# Patient Record
Sex: Female | Born: 1975 | Race: White | Hispanic: No | Marital: Married | Smoking: Never smoker
Health system: Southern US, Community
[De-identification: ages and names within clinical notes are randomized; demographics above are authoritative.]

## PROBLEM LIST (undated history)

## (undated) DIAGNOSIS — Z87440 Personal history of urinary (tract) infections: Secondary | ICD-10-CM

## (undated) DIAGNOSIS — B379 Candidiasis, unspecified: Secondary | ICD-10-CM

## (undated) DIAGNOSIS — F32A Depression, unspecified: Secondary | ICD-10-CM

## (undated) DIAGNOSIS — M51369 Other intervertebral disc degeneration, lumbar region without mention of lumbar back pain or lower extremity pain: Secondary | ICD-10-CM

## (undated) DIAGNOSIS — Z22330 Carrier of Group B streptococcus: Secondary | ICD-10-CM

## (undated) DIAGNOSIS — M5126 Other intervertebral disc displacement, lumbar region: Secondary | ICD-10-CM

## (undated) DIAGNOSIS — Z8619 Personal history of other infectious and parasitic diseases: Secondary | ICD-10-CM

## (undated) DIAGNOSIS — F329 Major depressive disorder, single episode, unspecified: Secondary | ICD-10-CM

## (undated) HISTORY — PX: OTHER SURGICAL HISTORY: SHX169

## (undated) HISTORY — DX: Personal history of other infectious and parasitic diseases: Z86.19

## (undated) HISTORY — DX: Depression, unspecified: F32.A

## (undated) HISTORY — PX: WISDOM TOOTH EXTRACTION: SHX21

## (undated) HISTORY — DX: Carrier of group B Streptococcus: Z22.330

## (undated) HISTORY — DX: Candidiasis, unspecified: B37.9

## (undated) HISTORY — DX: Major depressive disorder, single episode, unspecified: F32.9

## (undated) HISTORY — DX: Personal history of urinary (tract) infections: Z87.440

## (undated) HISTORY — DX: Other intervertebral disc displacement, lumbar region: M51.26

## (undated) HISTORY — DX: Other intervertebral disc degeneration, lumbar region without mention of lumbar back pain or lower extremity pain: M51.369

---

## 2000-12-18 ENCOUNTER — Other Ambulatory Visit: Admission: RE | Admit: 2000-12-18 | Discharge: 2000-12-18 | Payer: Self-pay | Admitting: Family Medicine

## 2002-11-23 ENCOUNTER — Other Ambulatory Visit: Admission: RE | Admit: 2002-11-23 | Discharge: 2002-11-23 | Payer: Self-pay | Admitting: Gynecology

## 2004-01-03 ENCOUNTER — Other Ambulatory Visit: Admission: RE | Admit: 2004-01-03 | Discharge: 2004-01-03 | Payer: Self-pay | Admitting: Gynecology

## 2005-01-14 ENCOUNTER — Other Ambulatory Visit: Admission: RE | Admit: 2005-01-14 | Discharge: 2005-01-14 | Payer: Self-pay | Admitting: Gynecology

## 2006-01-15 ENCOUNTER — Other Ambulatory Visit: Admission: RE | Admit: 2006-01-15 | Discharge: 2006-01-15 | Payer: Self-pay | Admitting: Gynecology

## 2006-10-27 ENCOUNTER — Inpatient Hospital Stay (HOSPITAL_COMMUNITY): Admission: AD | Admit: 2006-10-27 | Discharge: 2006-10-29 | Payer: Self-pay | Admitting: Obstetrics and Gynecology

## 2007-10-15 ENCOUNTER — Inpatient Hospital Stay (HOSPITAL_COMMUNITY): Admission: RE | Admit: 2007-10-15 | Discharge: 2007-10-15 | Payer: Self-pay | Admitting: Obstetrics and Gynecology

## 2007-10-30 ENCOUNTER — Inpatient Hospital Stay (HOSPITAL_COMMUNITY): Admission: AD | Admit: 2007-10-30 | Discharge: 2007-11-01 | Payer: Self-pay | Admitting: Obstetrics and Gynecology

## 2010-08-06 NOTE — H&P (Signed)
NAMEYAZAIRA, SPEAS                ACCOUNT NO.:  [id number]   MEDICAL RECORD NO.:  [id number]          PATIENT TYPE:  INP   LOCATION:  9162                          FACILITY:  [facility]   PHYSICIAN:  Osborn Coho, M.D.   DATE OF BIRTH:  13-Feb-1976   DATE OF ADMISSION:  10/30/2007  DATE OF DISCHARGE:                              HISTORY & PHYSICAL   This is a 35 year old gravida 2, para 1-0-0-1 at 38-0/7th weeks, who  presents with contractions for several hours.  She denies leaking,  bleeding, or reports positive fetal movement.  Pregnancy has been  followed by the nurse midwife service and remarkable for  1. Elevated BMI.  2. Closely spaced pregnancy.  3. History of depression.  4. History of prolonged postpartum bleeding.  5. Group B strep negative.   ALLERGIES:  None.   OB HISTORY:  Remarkable for a vaginal delivery in 2008 of a female infant  at [redacted] weeks gestation weighing 7 pounds 10 ounces.  Remarkable for 6  weeks of bleeding postpartum.   PAST MEDICAL HISTORY:  Remarkable for childhood varicella, prolonged  postpartum bleeding, and history of depression in the past.   PAST SURGICAL HISTORY:  Remarkable for right knee surgery at age 63.   FAMILY HISTORY:  Remarkable for grandmother with heart disease, aunt  with diabetes, cousin with thyroid problems, and grandmother with stroke  and lung cancer.   GENETIC HISTORY:  Negative.   SOCIAL HISTORY:  The patient is married to [employer], who is  involved and supportive.  She is of the [place] faith.  She denies any  alcohol, tobacco, or drug use.   PRENATAL LABS:  Hemoglobin 11.4 and platelets 318.  Blood type O+.  Antibody screen negative.  RPR nonreactive.  Rubella immune.  Hepatitis  negative.  HIV negative.  Pap test normal.  Gonorrhea negative.  Chlamydia negative.  Cystic fibrosis negative.   HISTORY OF CURRENT PREGNANCY:  The patient entered care at [redacted] weeks  gestation.  She declined first trimester screen.   Ultrasound at that  time was normal.  She had an ultrasound for anatomy at 18 weeks that was  normal.  Glucola at 29 weeks was elevated.  A 3-hour GTT was normal.  She was placed on iron for some anemia, which resolved by the end of  pregnancy and her group B strep was negative at term.   OBJECTIVE DATA:  VITAL SIGNS:  Stable, afebrile.  HEENT:  Within normal limits.  NECK:  Thyroid normal not enlarged.  CHEST:  Clear to auscultation.  HEART:  Regular rate and rhythm.  ABDOMEN:  Gravid at 38 cm.  VERTEX:  Leopold's exam shows reactive fetal heart rate with  contractions every 4 minutes.  Cervix is 3 cm, completely effaced, -1 to  0 station, and vertex presentation.  Her cervix had been 1 cm 2 days  ago.  EXTREMITIES:  Within normal limits.   ASSESSMENT:  1. Intrauterine pregnancy at 38-0/7th weeks.  2. Active labor.   PLAN:  1. Admit to birthing suites per Dr. Su Hilt.  2. Routine CNM orders.  3. Plans epidural.      Elby Showers. Williams, C.N.M.      Osborn Coho, M.D.  Electronically Signed    MLW/MEDQ  D:  10/30/2007  T:  10/30/2007  Job:  [id number]

## 2010-08-06 NOTE — H&P (Signed)
NAMEROSELAND, BRAUN                ACCOUNT NO.:  [id number]   MEDICAL RECORD NO.:  [id number]          PATIENT TYPE:  MAT   LOCATION:  MATC                          [facility]ILITY:  [facility]   PHYSICIAN:  Hal Morales, M.D.DATE OF BIRTH:  [birth year]   DATE OF ADMISSION:  10/27/2006  DATE OF DISCHARGE:                              HISTORY & PHYSICAL   Ms. Carreno is a 35 year old, gravida 1, para 0, at 52 and 2/7ths weeks,  who presented complaining of uterine contractions every 2-3 minutes  since 5 a.m. with a small amount of bloody show noted.   The pregnancy has been remarkable for:  1. Positive group B strep.  2. First trimester spotting.  3. History of depression, but no medications at present.   PRENATAL LABORATORIES:  Blood type is O positive, Rh antibody negative,  VDRL nonreactive, rubella titer positive, hepatitis B surface antigen  negative, HIV nonreactive.  Cystic fibrosis testing was negative.  GC  and Chlamydia cultures were negative in October.  Pap was normal in  October.  Hemoglobin upon entering the practice was 12.3.  It was 10.8  at 26 weeks.  Quadruple screen and first trimester screen were declined.  Glucola was normal.  Group B strep culture was positive at 36 weeks.  GC  and Chlamydia cultures were negative.   HISTORY OF PRESENT PREGNANCY:  The patient entered care at approximately  10 weeks.  She decline quadruple screen and first trimester screening.  She had an ultrasound at 18 weeks showing normal growth and development.  She had had some spotting in early pregnancy and had had a 7 week  ultrasound which confirmed dating.  She had some musculoskeletal issues  in her rib cage starting at approximately 22 weeks.  She also had some  reflux, for which she used Zantac.  Her Glucola was normal.  Her  hemoglobin was 10.8 at 26 weeks.  She had some decreased fetal movement  at 32 weeks.  NST was nonreactive.  BPP was 8/8 with normal fluid and  estimated fetal weight  was at the 76th percentile.  At 36 weeks, GC and  Chlamydia cultures were done, group B strep culture was done.  Group B  strep culture was positive.  Other cultures were negative.  She had some  left ear pain at that time and was recommended to try some Sudafed.  The  rest of her pregnancy has been essentially uncomplicated.  She was seen  at 39 weeks for decreased fetal movement.  She had a reactive NST.  She  was seen yesterday in the office and cervix was closed.   OBSTETRICAL HISTORY:  The patient is a primigravida.   MEDICAL HISTORY:  1. She is a previous condom user.  2. She has occasional urinary tract infections.  3. She had right knee surgery at age 45.   ALLERGIES:  She has no known medication allergies.   FAMILY HISTORY:  Her paternal grandmother has heart disease.  Maternal  cousin has asthma.  Maternal aunt has diabetes.  Maternal cousin has  thyroid disease.  Maternal grandmother  had a stroke.  Maternal  grandmother also had lung cancer.   GENETIC HISTORY:  Unremarkable.   SOCIAL HISTORY:  The patient is married.  Father of the baby is involved  and supportive.  His name is Darlina Mccaughey.  The patient is Caucasian, of  the [place] faith.  She has 2 years of college.  She is employed as an  Print production planner.  Her husband is college educated.  He is a Patent attorney.  She  has been followed by the physician's service at [facility].  She denies any alcohol, drug or tobacco use during this pregnancy.  She  has had some depression in the past but has not required any medication.   PHYSICAL EXAMINATION:  VITAL SIGNS:  Blood pressure is 134/84.  Other  vital signs are stable.  The patient is in moderate distress with  contractions.  HEENT:  Within normal limits.  LUNGS:  The breath sounds are clear.  HEART:  Regular rate and rhythm without murmur.  BREASTS:  Soft and nontender.  ABDOMEN:  Fundal height is approximately 38 cm.  Estimated fetal weight  is 7.5 to 8  pounds.  Uterine contractions are every 2-3 minutes,  moderate quality.  PELVIC:  Cervix is 3 cm, 100%, vertex at a -1 station with an intact bag  of water.  There is a small amount of bloody show noted.  Fetal heart  rate is reactive with no decelerations.  EXTREMITIES:  Deep tendon reflexes are 2+ without clonus.  There is a  trace edema noted.   IMPRESSION:  1. Intrauterine pregnancy at 40 and 2/7ths weeks.  2. Early labor.  3. Positive group B strep.   PLAN:  1. Admit to birthing suite for consult with Dr. Pennie Rushing as attending      physician.  2. Routine physician orders.  3. Penicillin G per standard dosing for group B strep prophylaxis.  4. Pain medication p.r.n. with patient initially preferring IV pain      medication, but then may move to possible epidural.      Chip Boer L. Emilee Hero, C.N.M.      Hal Morales, M.D.  Electronically Signed    VLL/MEDQ  D:  10/27/2006  T:  10/27/2006  Job:  [id number]

## 2010-12-20 LAB — CBC
HCT: 32.5 — ABNORMAL LOW
Hemoglobin: 12.2
MCV: 94.1
RBC: 3.45 — ABNORMAL LOW
RBC: 3.83 — ABNORMAL LOW
WBC: 13.9 — ABNORMAL HIGH

## 2010-12-20 LAB — RPR: RPR Ser Ql: NONREACTIVE

## 2011-01-06 LAB — CBC
HCT: 37.4
Hemoglobin: 10.1 — ABNORMAL LOW
Hemoglobin: 12.5
MCHC: 34.5
MCV: 92.8
MCV: 92.8
Platelets: 304
RDW: 13.8
RDW: 14
WBC: 14.7 — ABNORMAL HIGH

## 2011-04-28 ENCOUNTER — Other Ambulatory Visit: Payer: Self-pay | Admitting: Family Medicine

## 2011-04-28 DIAGNOSIS — M545 Low back pain: Secondary | ICD-10-CM

## 2011-04-30 ENCOUNTER — Ambulatory Visit
Admission: RE | Admit: 2011-04-30 | Discharge: 2011-04-30 | Disposition: A | Discharge status: Home / Self Care | Payer: 59 | Source: Ambulatory Visit | Attending: Family Medicine | Admitting: Family Medicine

## 2011-04-30 DIAGNOSIS — M545 Low back pain: Secondary | ICD-10-CM

## 2011-10-02 ENCOUNTER — Telehealth: Payer: Self-pay | Admitting: Obstetrics and Gynecology

## 2011-10-02 NOTE — Telephone Encounter (Signed)
TRIAGE/RX °

## 2011-10-03 ENCOUNTER — Telehealth: Payer: Self-pay

## 2011-10-03 NOTE — Telephone Encounter (Signed)
TC TO PT. LEFT VM THAT HER RX IS READY FOR PICK UP AT [facility],.

## 2011-10-07 ENCOUNTER — Other Ambulatory Visit: Payer: Self-pay | Admitting: Obstetrics and Gynecology

## 2011-10-22 ENCOUNTER — Encounter: Payer: Self-pay | Admitting: Obstetrics and Gynecology

## 2011-10-22 ENCOUNTER — Ambulatory Visit (INDEPENDENT_AMBULATORY_CARE_PROVIDER_SITE_OTHER): Payer: 59 | Admitting: Obstetrics and Gynecology

## 2011-10-22 VITALS — BP 114/68 | HR 70 | Ht 63.0 in | Wt 206.0 lb

## 2011-10-22 DIAGNOSIS — Z01419 Encounter for gynecological examination (general) (routine) without abnormal findings: Secondary | ICD-10-CM

## 2011-10-22 DIAGNOSIS — Z124 Encounter for screening for malignant neoplasm of cervix: Secondary | ICD-10-CM

## 2011-10-22 MED ORDER — NORETHIN ACE-ETH ESTRAD-FE 1-20 MG-MCG PO TABS: 1.0000 | ORAL_TABLET | Freq: Every day | ORAL | Status: DC

## 2011-10-22 NOTE — Progress Notes (Signed)
Subjective:    Tricia Todd is a 36 y.o. female, G2P2002, who presents for an annual exam. The patient reports becoming violentlyy ill  with the non-Junel generic.  Only wants Junel.  Menstrual cycle:   LMP: Patient's last menstrual period was 10/08/2011.           Review of Systems Pertinent items are noted in HPI. Denies pelvic pain, urinary tract symptoms, vaginitis symptoms, irregular bleeding, menopausal symptoms, change in bowel habits or rectal bleeding   Objective:    BP 114/68  Pulse 70  Ht 5\' 3"  (1.6 m)  Wt 206 lb (93.441 kg)  BMI 36.49 kg/m2  LMP 10/08/2011    Wt Readings from Last 1 Encounters:  10/22/11 206 lb (93.441 kg)   Body mass index is 36.49 kg/(m^2). General Appearance: Alert, no acute distress HEENT: Grossly normal Neck / Thyroid: Supple, no thyromegaly or cervical adenopathy Lungs: Clear to auscultation bilaterally Back: No CVA tenderness Breast Exam: No masses or nodes.No dimpling, nipple retraction or discharge. Cardiovascular: Regular rate and rhythm.  Gastrointestinal: Soft, non-tender, no masses or organomegaly Pelvic Exam: EGBUS-wnl, vagina-normal rugae, cervix- without lesions or tenderness, uterus appears normal size shape and consistency, adnexae-no masses or tenderness Lymphatic Exam: Non-palpable nodes in neck, clavicular,  axillary, or inguinal regions  Skin: no rashes or abnormalities Extremities: no clubbing cyanosis or edema  Neurologic: grossly normal Psychiatric: Alert and oriented    Assessment:   Routine GYN Exam Herniated Lumbar Disc Plan:  Junel 1/20  #1  1 po qd 11 refills  Brand Medically Necessary  PAP sent  RTO 1 year or prn  Darnella Zeiter,ELMIRAPA-C

## 2011-10-22 NOTE — Progress Notes (Signed)
Regular Periods: yes Mammogram: no  Monthly Breast Ex.: no Exercise: yes  Tetanus < 10 years: no Seatbelts: yes  NI. Bladder Functn.: yes Abuse at home: no  Daily BM's: yes Stressful Work: yes  Healthy Diet: yes Sigmoid-Colonoscopy: NO  Calcium: no Medical problems this year: NO   LAST PAP:2012  Contraception: JUNEL 1/20  Mammogram:  NO  PCP: DR. Azucena Cecil  PMH: DX WITH PROTRUDING DISC (LOWER DISC)  FMH: NO CHANGE  Last Bone Scan: NO

## 2011-10-23 LAB — PAP IG W/ RFLX HPV ASCU

## 2014-01-23 ENCOUNTER — Encounter: Payer: Self-pay | Admitting: Obstetrics and Gynecology

## 2017-05-01 DIAGNOSIS — K219 Gastro-esophageal reflux disease without esophagitis: Secondary | ICD-10-CM | POA: Diagnosis not present

## 2017-05-01 DIAGNOSIS — M5136 Other intervertebral disc degeneration, lumbar region: Secondary | ICD-10-CM | POA: Diagnosis not present

## 2017-05-01 DIAGNOSIS — F419 Anxiety disorder, unspecified: Secondary | ICD-10-CM | POA: Diagnosis not present

## 2017-07-02 DIAGNOSIS — Z01419 Encounter for gynecological examination (general) (routine) without abnormal findings: Secondary | ICD-10-CM | POA: Diagnosis not present

## 2017-07-02 DIAGNOSIS — Z1231 Encounter for screening mammogram for malignant neoplasm of breast: Secondary | ICD-10-CM | POA: Diagnosis not present

## 2017-07-02 DIAGNOSIS — Z113 Encounter for screening for infections with a predominantly sexual mode of transmission: Secondary | ICD-10-CM | POA: Diagnosis not present

## 2017-07-02 DIAGNOSIS — Z3040 Encounter for surveillance of contraceptives, unspecified: Secondary | ICD-10-CM | POA: Diagnosis not present

## 2017-07-02 DIAGNOSIS — Z124 Encounter for screening for malignant neoplasm of cervix: Secondary | ICD-10-CM | POA: Diagnosis not present

## 2017-07-02 DIAGNOSIS — Z6841 Body Mass Index (BMI) 40.0 and over, adult: Secondary | ICD-10-CM | POA: Diagnosis not present

## 2017-07-23 DIAGNOSIS — R922 Inconclusive mammogram: Secondary | ICD-10-CM | POA: Diagnosis not present

## 2018-06-03 DIAGNOSIS — M7989 Other specified soft tissue disorders: Secondary | ICD-10-CM | POA: Diagnosis not present

## 2018-06-03 DIAGNOSIS — R202 Paresthesia of skin: Secondary | ICD-10-CM | POA: Diagnosis not present

## 2018-06-03 DIAGNOSIS — F419 Anxiety disorder, unspecified: Secondary | ICD-10-CM | POA: Diagnosis not present

## 2018-06-03 DIAGNOSIS — R5383 Other fatigue: Secondary | ICD-10-CM | POA: Diagnosis not present

## 2018-06-03 DIAGNOSIS — M5136 Other intervertebral disc degeneration, lumbar region: Secondary | ICD-10-CM | POA: Diagnosis not present

## 2018-09-13 ENCOUNTER — Other Ambulatory Visit: Payer: Self-pay

## 2018-09-13 ENCOUNTER — Emergency Department (HOSPITAL_COMMUNITY)
Admission: EM | Admit: 2018-09-13 | Discharge: 2018-09-14 | Disposition: A | Discharge status: Home / Self Care | Payer: BC Managed Care – PPO | Attending: Emergency Medicine | Admitting: Emergency Medicine

## 2018-09-13 ENCOUNTER — Emergency Department (HOSPITAL_COMMUNITY): Payer: BC Managed Care – PPO

## 2018-09-13 ENCOUNTER — Encounter (HOSPITAL_COMMUNITY): Payer: Self-pay

## 2018-09-13 DIAGNOSIS — R0789 Other chest pain: Secondary | ICD-10-CM | POA: Diagnosis not present

## 2018-09-13 DIAGNOSIS — R0602 Shortness of breath: Secondary | ICD-10-CM | POA: Diagnosis not present

## 2018-09-13 DIAGNOSIS — R079 Chest pain, unspecified: Secondary | ICD-10-CM

## 2018-09-13 DIAGNOSIS — Z793 Long term (current) use of hormonal contraceptives: Secondary | ICD-10-CM | POA: Insufficient documentation

## 2018-09-13 LAB — CBC
HCT: 38 % (ref 36.0–46.0)
Hemoglobin: 12.2 g/dL (ref 12.0–15.0)
MCH: 29.9 pg (ref 26.0–34.0)
MCHC: 32.1 g/dL (ref 30.0–36.0)
MCV: 93.1 fL (ref 80.0–100.0)
Platelets: 295 10*3/uL (ref 150–400)
RBC: 4.08 MIL/uL (ref 3.87–5.11)
RDW: 12 % (ref 11.5–15.5)
WBC: 11.2 10*3/uL — ABNORMAL HIGH (ref 4.0–10.5)
nRBC: 0 % (ref 0.0–0.2)

## 2018-09-13 LAB — BASIC METABOLIC PANEL
Anion gap: 10 (ref 5–15)
BUN: 12 mg/dL (ref 6–20)
CO2: 21 mmol/L — ABNORMAL LOW (ref 22–32)
Calcium: 9.1 mg/dL (ref 8.9–10.3)
Chloride: 106 mmol/L (ref 98–111)
Creatinine, Ser: 0.99 mg/dL (ref 0.44–1.00)
GFR calc Af Amer: >60 mL/min (ref >60)
GFR calc non Af Amer: >60 mL/min (ref >60)
Glucose, Bld: 140 mg/dL — ABNORMAL HIGH (ref 70–99)
Potassium: 4 mmol/L (ref 3.5–5.1)
Sodium: 137 mmol/L (ref 135–145)

## 2018-09-13 LAB — I-STAT BETA HCG BLOOD, ED (MC, WL, AP ONLY): I-stat hCG, quantitative: <5 m[IU]/mL (ref <5)

## 2018-09-13 LAB — TROPONIN I: Troponin I: <0.03 ng/mL (ref <0.03)

## 2018-09-13 MED ORDER — SODIUM CHLORIDE 0.9% FLUSH: 3.0000 mL | Freq: Once | INTRAVENOUS | Status: DC

## 2018-09-13 NOTE — ED Triage Notes (Signed)
Pt states she was playing with her kid in the yard when she suddenly felt 10/10 chest pain radiating to her back, accompanied by SOB. Pt called EMS told her that all of her VS, EKG, CBG looked WNL, however, advised her to still come to the ED for lab work. Currently pt states she is feeling slightly SOB.

## 2018-09-14 ENCOUNTER — Encounter (HOSPITAL_COMMUNITY): Payer: Self-pay | Admitting: Student

## 2018-09-14 LAB — HEPATIC FUNCTION PANEL
ALT: 20 U/L (ref 0–44)
AST: 19 U/L (ref 15–41)
Albumin: 3.5 g/dL (ref 3.5–5.0)
Alkaline Phosphatase: 71 U/L (ref 38–126)
Bilirubin, Direct: 0.1 mg/dL (ref 0.0–0.2)
Indirect Bilirubin: 0.7 mg/dL (ref 0.3–0.9)
Total Bilirubin: 0.8 mg/dL (ref 0.3–1.2)
Total Protein: 6.9 g/dL (ref 6.5–8.1)

## 2018-09-14 LAB — TROPONIN I: Troponin I: <0.03 ng/mL (ref <0.03)

## 2018-09-14 LAB — LIPASE, BLOOD: Lipase: 28 U/L (ref 11–51)

## 2018-09-14 MED ORDER — ASPIRIN 81 MG PO CHEW
324.0000 mg | CHEWABLE_TABLET | Freq: Once | ORAL | Status: AC
  Administered 2018-09-14: 324 mg via ORAL
  Filled 2018-09-14: qty 4

## 2018-09-14 NOTE — ED Provider Notes (Signed)
[place] EMERGENCY DEPARTMENT Provider Note   CSN: [id number] Arrival date & time: 09/13/18  1844     History   Chief Complaint Chief Complaint  Patient presents with  . Chest Pain    HPI Tricia Todd is a 43 y.o. female with a history of depression who presents to the emergency department via EMS with complaints of chest pain that began around 17:00 this afternoon which is fairly resolved at present.  Patient states that she was sitting in the pool with her children in the backyard at rest when she developed discomfort to her upper abdomen which then radiated up into her chest and around both sides of her rib cage, not straight through to the back.  She states the pain felt like a pressure and heaviness with associated shortness of breath, anxiety, and diaphoresis.  Symptoms remain fairly constant, were severe at the time, without alleviating or aggravating factors.  No change occurred with exertion or with a deep breath.  She tried resting, pacing, and having a glass of water without relief.  Ultimately decided to call 911.  Upon EMS arrival patient states symptoms had fairly much resolved.  They recommended she come to the emergency department for evaluation.  Symptoms have not reoccurred since initial episode.  She states she has had some remaining very mild chest pressure that is about a 2 out of 10 at this time.  No history of similar discomfort.  Denies fever, chills, nausea, vomiting, lightheadedness, dizziness, syncope, diarrhea, melena, hematochezia, dysuria, leg pain/swelling, hemoptysis, recent surgery/trauma, recent long travel,  personal hx of cancer, or hx of DVT/PE.  Denies early family history of CAD.  She does take birth control.        HPI  Past Medical History:  Diagnosis Date  . Depression   . GBS carrier   . H/O varicella   . History of rubella   . Hx: UTI (urinary tract infection)   . L4-L5 disc bulge    PROTRUDING DISC  . Yeast  infection     There are no active problems to display for this patient.   Past Surgical History:  Procedure Laterality Date  . right knee surgery      Age 60  . WISDOM TOOTH EXTRACTION       OB History    Gravida  2   Para  2   Term  2   Preterm      AB      Living  2     SAB      TAB      Ectopic      Multiple      Live Births  2            Home Medications    Prior to Admission medications   Medication Sig Start Date End Date Taking? Authorizing Provider  HYDROcodone-acetaminophen (VICODIN) 5-500 MG per tablet Take 1 tablet by mouth every 6 (six) hours as needed.    [provider]  MICROGESTIN FE 1/20 1-20 MG-MCG tablet TAKE 1 TABLET BY MOUTH EVERY DAY 10/07/11   Earnstine Regal, PA-C  norethindrone-ethinyl estradiol (JUNEL FE,GILDESS FE,LOESTRIN FE) 1-20 MG-MCG tablet Take 1 tablet by mouth daily. 10/22/11   Earnstine Regal, PA-C  PREDNISONE PO Take by mouth.    [provider]    Family History Family History  Problem Relation Age of Onset  . Stroke Other   . Diabetes Maternal Aunt   . Cancer  Maternal Grandmother        Lung  . Heart disease Paternal Grandmother   . Cancer Maternal Uncle   . Cancer Maternal Uncle   . Heart attack Maternal Uncle     Social History Social History   Tobacco Use  . Smoking status: Never Smoker  . Smokeless tobacco: Never Used  Substance Use Topics  . Alcohol use: Yes    Comment: OCCASIONAL  . Drug use: No     Allergies   Patient has no known allergies.   Review of Systems Review of Systems  Constitutional: Positive for diaphoresis. Negative for chills and fever.  HENT: Negative for congestion, ear pain and sore throat.   Respiratory: Positive for shortness of breath. Negative for cough.   Cardiovascular: Positive for chest pain. Negative for palpitations and leg swelling.  Gastrointestinal: Positive for abdominal pain. Negative for blood in stool, constipation, diarrhea, nausea  and vomiting.  Genitourinary: Negative for dysuria, vaginal bleeding and vaginal discharge.  Neurological: Negative for dizziness, seizures, syncope, speech difficulty, weakness, light-headedness, numbness and headaches.  All other systems reviewed and are negative.    Physical Exam Updated Vital Signs BP 125/86   Pulse 85   Temp 98.5 F (36.9 C) (Oral)   Resp (!) 29   LMP 08/30/2018 (Within Days)   SpO2 99%   Physical Exam Vitals signs and nursing note reviewed.  Constitutional:      General: She is not in acute distress.    Appearance: She is well-developed. She is not toxic-appearing.  HENT:     Head: Normocephalic and atraumatic.  Eyes:     General:        Right eye: No discharge.        Left eye: No discharge.     Conjunctiva/sclera: Conjunctivae normal.  Neck:     Musculoskeletal: Neck supple.  Cardiovascular:     Rate and Rhythm: Normal rate and regular rhythm.     Pulses:          Radial pulses are 2+ on the right side and 2+ on the left side.       Posterior tibial pulses are 2+ on the right side and 2+ on the left side.  Pulmonary:     Effort: Pulmonary effort is normal. No respiratory distress.     Breath sounds: Normal breath sounds. No wheezing, rhonchi or rales.  Abdominal:     General: There is no distension.     Palpations: Abdomen is soft.     Tenderness: There is no abdominal tenderness. There is no guarding or rebound.  Musculoskeletal:     Right lower leg: She exhibits no tenderness. No edema.     Left lower leg: She exhibits no tenderness. No edema.  Skin:    General: Skin is warm and dry.     Findings: No rash.  Neurological:     Mental Status: She is alert.     Comments: Clear speech.   Psychiatric:        Behavior: Behavior normal.    ED Treatments / Results  Labs (all labs ordered are listed, but only abnormal results are displayed) Labs Reviewed  BASIC METABOLIC PANEL - Abnormal; Notable for the following components:      Result  Value   CO2 21 (*)    Glucose, Bld 140 (*)    All other components within normal limits  CBC - Abnormal; Notable for the following components:   WBC 11.2 (*)  All other components within normal limits  TROPONIN I  TROPONIN I  LIPASE, BLOOD  HEPATIC FUNCTION PANEL  I-STAT BETA HCG BLOOD, ED ([facility], [facility], [facility] ONLY)    EKG   EKG Interpretation  Date/Time:  Monday September 13 2018 19:01:00 EDT Ventricular Rate:  76 PR Interval:  152 QRS Duration: 80 QT Interval:  384 QTC Calculation: 432 R Axis:   12 Text Interpretation:  Normal sinus rhythm Cannot rule out Anterior infarct , age undetermined Abnormal ECG Confirmed by Gerhard MunchLockwood, Robert [phone]([id number]) on 09/14/2018 3:18:06 AM  Radiology Dg Chest 2 View  Result Date: 09/13/2018 CLINICAL DATA:  Chest pain and shortness of breath. EXAM: CHEST - 2 VIEW COMPARISON:  None. FINDINGS: The cardiomediastinal contours are normal. Mild bronchial thickening. Pulmonary vasculature is normal. No consolidation, pleural effusion, or pneumothorax. No acute osseous abnormalities are seen. IMPRESSION: Mild bronchial thickening which can be seen with bronchitis or asthma. Electronically Signed   By: Narda RutherfordMelanie  Sanford M.D.   On: 09/13/2018 19:55    Procedures Procedures (including critical care time)  Medications Ordered in ED Medications  sodium chloride flush (NS) 0.9 % injection 3 mL (3 mLs Intravenous Not Given 09/14/18 0350)  aspirin chewable tablet 324 mg (324 mg Oral Given 09/14/18 0346)     Initial Impression / Assessment and Plan / ED Course  I have reviewed the triage vital signs and the nursing notes.  Pertinent labs & imaging results that were available during my care of the patient were reviewed by me and considered in my medical decision making (see chart for details).    Patient presents to the emergency department with chest pain. Patient nontoxic appearing, in no apparent distress, vitals without significant abnormality . Fairly benign physical exam.  DDX: ACS, pulmonary embolism, dissection, pneumothorax, effusion, infiltrate, arrhythmia, anemia, electrolyte derangement, MSK, GERD, pancreatitis, cholecystitis, anxiety. Evaluation initiated with labs, EKG, and CXR. Patient on cardiac monitor.   Work-up in the ER reviewed:  CBC: Nonspecific leukocytosis at 11.2.  No anemia. BMP: Hyperglycemia at 140.  Mildly low bicarb at 21.  No significant electrolyte derangement. Hepatic function panel: WNL Lipase: WNL Troponin: delta negative EKG: no STEMI CXR: Mild bronchial thickening which can be seen with asthma/bronchitis--> lungs CTA, no complaints of fever/cough/wheezing, & her dyspnea is now resolved. Negative, without infiltrate, effusion, pneumothorax, or fracture/dislocation.   Low risk heart score, EKG without obvious ischemia, delta troponin negative, doubt ACS. Patient is low risk wells, doubt pulmonary embolism. Pain is not a tearing sensation, symmetric pulses, no widening of mediastinum on CXR, doubt dissection. No peritoneal signs on abdominal exam, belly labs reassuring- doubt pancreatitis, cholecystitis, or other acute surgical abdominal process. Cardiac monitor reviewed, no notable arrhythmias or tachycardia. occasional documented elevated RR based off automated monitor- suspect these are inaccurate, patient w/ regular respiratory rate on each of my assessments, no signs of respiratory distress. Patient has appeared hemodynamically stable throughout ER visit and appears safe for discharge with close PCP/cardiology follow up. I discussed results, treatment plan, need for PCP follow-up, and return precautions with the patient. Provided opportunity for questions, patient confirmed understanding and is in agreement with plan.   Findings and plan of care discussed with supervising physician Dr. Jeraldine LootsLockwood who is in agreement.   Final Clinical Impressions(s) / ED Diagnoses   Final diagnoses:  Chest pain, unspecified type    ED Discharge Orders     None       Cherly Andersonetrucelli,  R, PA-C 09/14/18 0431    Gerhard MunchLockwood, Robert,  MD 09/18/18 2330

## 2018-09-14 NOTE — Discharge Instructions (Signed)
You were seen in the emergency department today for chest pain. Your work-up in the emergency department has been overall reassuring. Your labs have been fairly normal and or similar to previous blood work you have had done. Your blood sugar was somewhat elevated @ 140 and your white blood cell count was minimally elevated @ 11.2- these should be rechecked by primary care.  Your EKG and the enzyme we use to check your heart did not show an acute heart attack at this time. Your chest x-ray showed some bronchial thickening- this also can be discussed with primary care.   We would like you to follow up closely with your primary care provider and/or the cardiologist provided in your discharge instructions within 1-3 days. Return to the ER immediately should you experience any new or worsening symptoms including but not limited to return of pain, worsened pain, vomiting, shortness of breath, dizziness, lightheadedness, passing out, or any other concerns that you may have.

## 2018-09-15 DIAGNOSIS — Z1231 Encounter for screening mammogram for malignant neoplasm of breast: Secondary | ICD-10-CM | POA: Diagnosis not present

## 2018-09-15 DIAGNOSIS — Z3040 Encounter for surveillance of contraceptives, unspecified: Secondary | ICD-10-CM | POA: Diagnosis not present

## 2018-09-15 DIAGNOSIS — R399 Unspecified symptoms and signs involving the genitourinary system: Secondary | ICD-10-CM | POA: Diagnosis not present

## 2018-09-15 DIAGNOSIS — Z6841 Body Mass Index (BMI) 40.0 and over, adult: Secondary | ICD-10-CM | POA: Diagnosis not present

## 2018-09-15 DIAGNOSIS — Z01419 Encounter for gynecological examination (general) (routine) without abnormal findings: Secondary | ICD-10-CM | POA: Diagnosis not present

## 2018-09-16 DIAGNOSIS — R0789 Other chest pain: Secondary | ICD-10-CM | POA: Diagnosis not present

## 2018-09-16 DIAGNOSIS — F419 Anxiety disorder, unspecified: Secondary | ICD-10-CM | POA: Diagnosis not present

## 2018-09-16 DIAGNOSIS — K219 Gastro-esophageal reflux disease without esophagitis: Secondary | ICD-10-CM | POA: Diagnosis not present

## 2018-09-28 ENCOUNTER — Ambulatory Visit (INDEPENDENT_AMBULATORY_CARE_PROVIDER_SITE_OTHER): Payer: BC Managed Care – PPO | Admitting: Cardiology

## 2018-09-28 ENCOUNTER — Other Ambulatory Visit: Payer: Self-pay

## 2018-09-28 ENCOUNTER — Encounter: Payer: Self-pay | Admitting: Cardiology

## 2018-09-28 VITALS — BP 120/68 | HR 71 | Temp 98.7°F | Ht 63.0 in | Wt 241.0 lb

## 2018-09-28 DIAGNOSIS — Z13220 Encounter for screening for lipoid disorders: Secondary | ICD-10-CM | POA: Diagnosis not present

## 2018-09-28 DIAGNOSIS — R079 Chest pain, unspecified: Secondary | ICD-10-CM | POA: Insufficient documentation

## 2018-09-28 DIAGNOSIS — R0789 Other chest pain: Secondary | ICD-10-CM

## 2018-09-28 DIAGNOSIS — R0609 Other forms of dyspnea: Secondary | ICD-10-CM

## 2018-09-28 NOTE — Progress Notes (Signed)
Patient referred by Antony Contras, MD for chest pain  Subjective:   Tricia Todd, female    DOB: 03-18-1976, 43 y.o.   MRN: [id number]   Chief Complaint  Patient presents with  . Chest Pain    2 weeks ago, stomach/chest pain  . New Patient (Initial Visit)     HPI  43 y.o. Caucasian female with chest pain.  Patient had an episode of chest pain two weeks ago.  Patient was playing with intense and pull outside her house, as when she had sudden onset right-sided abdominal pain that radiated to her chest, associated with shortness of breath.  Symptoms persisted for long time prompting her to call 911.  [facility] was seen in [facility]. EKG was normal, trop were negative x2. Chest Xray showed mild bronchial thickening which can be seen with bronchitis or asthma.  Acute abdominal labs are also normal.  Patient was discharged.  Patient has not had any recurrence of similar symptoms since then, she continues to experience chest pressure with or without exercise, along with exertional dyspnea-which is chronic.  She is a non-smoker.  Her father has history of coronary artery disease.     Past Medical History:  Diagnosis Date  . Depression   . GBS carrier   . H/O varicella   . History of rubella   . Hx: UTI (urinary tract infection)   . L4-L5 disc bulge    PROTRUDING DISC  . Yeast infection      Past Surgical History:  Procedure Laterality Date  . right knee surgery      Age 44  . WISDOM TOOTH EXTRACTION       Social History   Socioeconomic History  . Marital status: Married    Spouse name: Not on file  . Number of children: Not on file  . Years of education: Not on file  . Highest education level: Not on file  Occupational History  . Not on file  Social Needs  . Financial resource strain: Not on file  . Food insecurity    Worry: Not on file    Inability: Not on file  . Transportation needs    Medical: Not on file    Non-medical: Not on file  Tobacco Use   . Smoking status: Never Smoker  . Smokeless tobacco: Never Used  Substance and Sexual Activity  . Alcohol use: Yes    Comment: OCCASIONAL  . Drug use: No  . Sexual activity: Yes    Birth control/protection: Pill    Comment: JUNEL  FE 1/20  Lifestyle  . Physical activity    Days per week: Not on file    Minutes per session: Not on file  . Stress: Not on file  Relationships  . Social Herbalist on phone: Not on file    Gets together: Not on file    Attends religious service: Not on file    Active member of club or organization: Not on file    Attends meetings of clubs or organizations: Not on file    Relationship status: Not on file  . Intimate partner violence    Fear of current or ex partner: Not on file    Emotionally abused: Not on file    Physically abused: Not on file    Forced sexual activity: Not on file  Other Topics Concern  . Not on file  Social History Narrative  . Not on file  Family History  Problem Relation Age of Onset  . Stroke Other   . Diabetes Maternal Aunt   . Cancer Maternal Grandmother        Lung  . Heart disease Paternal Grandmother   . Cancer Maternal Uncle   . Cancer Maternal Uncle   . Heart attack Maternal Uncle      Current Outpatient Medications on File Prior to Visit  Medication Sig Dispense Refill  . HYDROcodone-acetaminophen (VICODIN) 5-500 MG per tablet Take 1 tablet by mouth every 6 (six) hours as needed.    Marland Kitchen. MICROGESTIN FE 1/20 1-20 MG-MCG tablet TAKE 1 TABLET BY MOUTH EVERY DAY 28 tablet 9  . norethindrone-ethinyl estradiol (JUNEL FE,GILDESS FE,LOESTRIN FE) 1-20 MG-MCG tablet Take 1 tablet by mouth daily. 1 Package 11  . PREDNISONE PO Take by mouth.     No current facility-administered medications on file prior to visit.     Cardiovascular studies:  EKG 09/13/2018: Sinus rhythm. Normal EKG.  Chest Xray 09/13/2018: Mild bronchial thickening which can be seen with bronchitis or asthma.    Recent labs:  Results for Gaetano HawthorneSTALEY, Nekesha P (MRN [id number]) as of 09/28/2018 11:57  Ref. Range 09/13/2018 19:30 09/14/2018 02:46  BASIC METABOLIC PANEL Unknown Rpt (A)   Sodium Latest Ref Range: 135 - 145 mmol/L 137   Potassium Latest Ref Range: 3.5 - 5.1 mmol/L 4.0   Chloride Latest Ref Range: 98 - 111 mmol/L 106   CO2 Latest Ref Range: 22 - 32 mmol/L 21 (L)   Glucose Latest Ref Range: 70 - 99 mg/dL 829140 (H)   BUN Latest Ref Range: 6 - 20 mg/dL 12   Creatinine Latest Ref Range: 0.44 - 1.00 mg/dL 5.620.99   Calcium Latest Ref Range: 8.9 - 10.3 mg/dL 9.1   Anion gap Latest Ref Range: 5 - 15  10   Alkaline Phosphatase Latest Ref Range: 38 - 126 U/L  71  Albumin Latest Ref Range: 3.5 - 5.0 g/dL  3.5  Lipase Latest Ref Range: 11 - 51 U/L  28  AST Latest Ref Range: 15 - 41 U/L  19  ALT Latest Ref Range: 0 - 44 U/L  20  Total Protein Latest Ref Range: 6.5 - 8.1 g/dL  6.9  Bilirubin, Direct Latest Ref Range: 0.0 - 0.2 mg/dL  0.1  Indirect Bilirubin Latest Ref Range: 0.3 - 0.9 mg/dL  0.7  Total Bilirubin Latest Ref Range: 0.3 - 1.2 mg/dL  0.8  GFR, Est Non African American Latest Ref Range: >60 mL/min >60   GFR, Est African American Latest Ref Range: >60 mL/min >60   Troponin I Latest Ref Range: <0.03 ng/mL <0.03 <0.03   Results for Gaetano HawthorneSTALEY, Melvina P (MRN [id number]) as of 09/28/2018 11:57  Ref. Range 09/13/2018 19:30  WBC Latest Ref Range: 4.0 - 10.5 K/uL 11.2 (H)  RBC Latest Ref Range: 3.87 - 5.11 MIL/uL 4.08  Hemoglobin Latest Ref Range: 12.0 - 15.0 g/dL 69.612.2  HCT Latest Ref Range: 36.0 - 46.0 % 38.0  MCV Latest Ref Range: 80.0 - 100.0 fL 93.1  MCH Latest Ref Range: 26.0 - 34.0 pg 29.9  MCHC Latest Ref Range: 30.0 - 36.0 g/dL 29.532.1  RDW Latest Ref Range: 11.5 - 15.5 % 12.0  Platelets Latest Ref Range: 150 - 400 K/uL 295  nRBC Latest Ref Range: 0.0 - 0.2 % 0.0    Review of Systems  Constitution: Negative for decreased appetite, malaise/fatigue, weight gain and weight loss.  HENT: Negative for congestion.  Eyes: Negative for visual disturbance.  Cardiovascular: Positive for chest pain and dyspnea on exertion. Negative for leg swelling, palpitations and syncope.  Respiratory: Positive for shortness of breath. Negative for cough.   Endocrine: Negative for cold intolerance.  Hematologic/Lymphatic: Does not bruise/bleed easily.  Skin: Negative for itching and rash.  Musculoskeletal: Negative for myalgias.  Gastrointestinal: Negative for abdominal pain, nausea and vomiting.  Genitourinary: Negative for dysuria.  Neurological: Negative for dizziness and weakness.  Psychiatric/Behavioral: The patient is not nervous/anxious.   All other systems reviewed and are negative.        Vitals:   09/28/18 1343  BP: 120/68  Pulse: 71  Temp: 98.7 F (37.1 C)  SpO2: 98%     Body mass index is 42.69 kg/m. Filed Weights   09/28/18 1343  Weight: 241 lb (109.3 kg)     Objective:   Physical Exam  Constitutional: She is oriented to person, place, and time. She appears well-developed and well-nourished. No distress.  HENT:  Head: Normocephalic and atraumatic.  Eyes: Pupils are equal, round, and reactive to light. Conjunctivae are normal.  Neck: No JVD present.  Cardiovascular: Normal rate, regular rhythm, normal heart sounds and intact distal pulses.  Pulmonary/Chest: Effort normal and breath sounds normal. She has no wheezes. She has no rales.  Abdominal: Soft. Bowel sounds are normal. There is no rebound.  Musculoskeletal:        General: No edema.  Lymphadenopathy:    She has no cervical adenopathy.  Neurological: She is alert and oriented to person, place, and time. No cranial nerve deficit.  Skin: Skin is warm and dry.  Psychiatric: She has a normal mood and affect.  Nursing note and vitals reviewed.         Assessment & Recommendations:   43 y.o. Caucasian female with chest pain.  Chest pain: Atypical. Suspicion of angina is low. However, given her exertional shortness of  breath and family h/o CAD, recommend echocardiogram and nuclear stress test. We are currently not performing exercise treadmill stress test due to concern for COVID transmission. Coronary CTA is not suitable given her body habitus. Recommend screening lipid panel, if not previously performed by PCP.    Thank you for referring the patient to us. Please feel free to contact with any questions.  Elder NegusManish J Elzabeth Mcquerry, MD [facility] Pager: [phone] Office: [phone] If no answer Cell [phone]

## 2018-10-11 ENCOUNTER — Ambulatory Visit (INDEPENDENT_AMBULATORY_CARE_PROVIDER_SITE_OTHER): Payer: BC Managed Care – PPO

## 2018-10-11 ENCOUNTER — Other Ambulatory Visit: Payer: Self-pay

## 2018-10-11 DIAGNOSIS — R0609 Other forms of dyspnea: Secondary | ICD-10-CM | POA: Diagnosis not present

## 2018-10-11 DIAGNOSIS — R079 Chest pain, unspecified: Secondary | ICD-10-CM

## 2018-10-13 ENCOUNTER — Other Ambulatory Visit: Payer: BC Managed Care – PPO

## 2018-10-15 ENCOUNTER — Other Ambulatory Visit: Payer: Self-pay

## 2018-10-15 ENCOUNTER — Ambulatory Visit (INDEPENDENT_AMBULATORY_CARE_PROVIDER_SITE_OTHER): Payer: BC Managed Care – PPO

## 2018-10-15 DIAGNOSIS — R0609 Other forms of dyspnea: Secondary | ICD-10-CM

## 2018-10-15 DIAGNOSIS — R0789 Other chest pain: Secondary | ICD-10-CM | POA: Diagnosis not present

## 2018-10-15 DIAGNOSIS — R079 Chest pain, unspecified: Secondary | ICD-10-CM

## 2018-10-20 ENCOUNTER — Encounter: Payer: Self-pay | Admitting: Cardiology

## 2018-10-20 ENCOUNTER — Ambulatory Visit: Payer: BC Managed Care – PPO | Admitting: Cardiology

## 2018-10-20 ENCOUNTER — Other Ambulatory Visit: Payer: Self-pay

## 2018-10-20 VITALS — Ht 63.0 in | Wt 240.0 lb

## 2018-10-20 DIAGNOSIS — R0789 Other chest pain: Secondary | ICD-10-CM | POA: Diagnosis not present

## 2018-10-20 DIAGNOSIS — R079 Chest pain, unspecified: Secondary | ICD-10-CM

## 2018-10-20 NOTE — Progress Notes (Signed)
Patient referred by Tricia Todd, David, MD for chest pain  Subjective:   Tricia Todd, female    DOB: 05/12/1975, 43 y.o.   MRN: [id number]  I connected with the patient on 10/20/2018 by a telephone call and verified that I am speaking with the correct person using two identifiers.     I offered the patient a video enabled application for a virtual visit. Unfortunately, this could not be accomplished due to technical difficulties/lack of video enabled phone/computer. I discussed the limitations of evaluation and management by telemedicine and the availability of in person appointments. The patient expressed understanding and agreed to proceed.   This visit type was conducted due to national recommendations for restrictions regarding the COVID-19 Pandemic (e.g. social distancing).  This format is felt to be most appropriate for this patient at this time.  All issues noted in this document were discussed and addressed.  No physical exam was performed (except for noted visual exam findings with Tele health visits).  The patient has consented to conduct a Tele health visit and understands insurance will be billed.    Chief Complaint  Patient presents with  . Chest Pain    f/u tests     HPI  43 y.o. Caucasian female with chest pain.  Her chest pain symptoms have significantly improved.  She still occasionally feels chest heaviness, almost always at rest.  She does not notice any chest heaviness while doing any physical activity.  Echocardiogram and stress test findings, details below, discussed with the patient.   Past Medical History:  Diagnosis Date  . Depression   . GBS carrier   . H/O varicella   . History of rubella   . Hx: UTI (urinary tract infection)   . L4-L5 disc bulge    PROTRUDING DISC  . Yeast infection      Past Surgical History:  Procedure Laterality Date  . right knee surgery      Age [age]  . WISDOM TOOTH EXTRACTION       Social History   Socioeconomic  History  . Marital status: Married    Spouse name: Not on file  . Number of children: 2  . Years of education: Not on file  . Highest education level: Not on file  Occupational History  . Not on file  Social Needs  . Financial resource strain: Not on file  . Food insecurity    Worry: Not on file    Inability: Not on file  . Transportation needs    Medical: Not on file    Non-medical: Not on file  Tobacco Use  . Smoking status: Never Smoker  . Smokeless tobacco: Never Used  Substance and Sexual Activity  . Alcohol use: Yes    Comment: OCCASIONAL  . Drug use: No  . Sexual activity: Yes    Birth control/protection: Pill    Comment: JUNEL  FE 1/20  Lifestyle  . Physical activity    Days per week: Not on file    Minutes per session: Not on file  . Stress: Not on file  Relationships  . Social Musicianconnections    Talks on phone: Not on file    Gets together: Not on file    Attends religious service: Not on file    Active member of club or organization: Not on file    Attends meetings of clubs or organizations: Not on file    Relationship status: Not on file  . Intimate partner violence  Fear of current or ex partner: Not on file    Emotionally abused: Not on file    Physically abused: Not on file    Forced sexual activity: Not on file  Other Topics Concern  . Not on file  Social History Narrative  . Not on file     Family History  Problem Relation Age of Onset  . Diabetes Maternal Aunt   . Cancer Maternal Grandmother        Lung  . Heart disease Paternal Grandmother   . Stroke Paternal Grandmother   . Cancer Maternal Uncle   . Cancer Maternal Uncle   . Heart attack Maternal Uncle   . Other Mother   . Heart disease Father   . Deafness Father      Current Outpatient Medications on File Prior to Visit  Medication Sig Dispense Refill  . ALPRAZolam (XANAX) 1 MG tablet Take 1 mg by mouth as needed.    . Esomeprazole Magnesium (NEXIUM 24HR PO) Take 1 capsule by  mouth daily.    Marland Kitchen HYDROcodone-acetaminophen (VICODIN) 5-500 MG per tablet Take 1 tablet by mouth every 6 (six) hours as needed.    . norethindrone-ethinyl estradiol (JUNEL FE 1/20) 1-20 MG-MCG tablet Junel FE 1/20 (28) 1 mg-20 mcg (21)/75 mg (7) tablet  TAKE 1 TABLET BY MOUTH EVERY DAY     No current facility-administered medications on file prior to visit.     Cardiovascular studies:  Lexiscan Sestamibi stress test 10/15/2018:  Lexiscan stress test was performed. Stress EKG is non-diagnostic, as this is pharmacological stress test. SPECT imaging shows small size, mild intensity, reversible perfusion defect in apical/apical anterior myocardium. LVEF 67%. Low risk study.   Echocardiogram 10/11/2018: Left ventricle cavity is normal in size. Normal left ventricular wall thickness. Normal LV systolic function with EF 55%. Normal global wall motion. Normal diastolic filling pattern.  Mild (Grade I) mitral regurgitation. Mild tricuspid regurgitation. Estimated pulmonary artery systolic pressure is 24 mmHg. IVC is dilated with a respiratory response of >50%. Estimated RA pressure 8 mmHg.  EKG 09/13/2018: Sinus rhythm. Normal EKG.  Chest Xray 09/13/2018: Mild bronchial thickening which can be seen with bronchitis or asthma.    Recent labs: Results for Tricia Todd (MRN [id number]) as of 09/28/2018 11:57  Ref. Range 09/13/2018 19:30 09/14/2018 40:98  BASIC METABOLIC PANEL Unknown Rpt (A)   Sodium Latest Ref Range: 135 - 145 mmol/L 137   Potassium Latest Ref Range: 3.5 - 5.1 mmol/L 4.0   Chloride Latest Ref Range: 98 - 111 mmol/L 106   CO2 Latest Ref Range: 22 - 32 mmol/L 21 (L)   Glucose Latest Ref Range: 70 - 99 mg/dL 140 (H)   BUN Latest Ref Range: 6 - 20 mg/dL 12   Creatinine Latest Ref Range: 0.44 - 1.00 mg/dL 0.99   Calcium Latest Ref Range: 8.9 - 10.3 mg/dL 9.1   Anion gap Latest Ref Range: 5 - 15  10   Alkaline Phosphatase Latest Ref Range: 38 - 126 U/L  71  Albumin Latest  Ref Range: 3.5 - 5.0 g/dL  3.5  Lipase Latest Ref Range: 11 - 51 U/L  28  AST Latest Ref Range: 15 - 41 U/L  19  ALT Latest Ref Range: 0 - 44 U/L  20  Total Protein Latest Ref Range: 6.5 - 8.1 g/dL  6.9  Bilirubin, Direct Latest Ref Range: 0.0 - 0.2 mg/dL  0.1  Indirect Bilirubin Latest Ref Range: 0.3 - 0.9 mg/dL  0.7  Total Bilirubin Latest Ref Range: 0.3 - 1.2 mg/dL  0.8  GFR, Est Non African American Latest Ref Range: >60 mL/min >60   GFR, Est African American Latest Ref Range: >60 mL/min >60   Troponin I Latest Ref Range: <0.03 ng/mL <0.03 <0.03   Results for Tricia HawthorneSTALEY, Jeneal P (MRN [id number]) as of 09/28/2018 11:57  Ref. Range 09/13/2018 19:30  WBC Latest Ref Range: 4.0 - 10.5 K/uL 11.2 (H)  RBC Latest Ref Range: 3.87 - 5.11 MIL/uL 4.08  Hemoglobin Latest Ref Range: 12.0 - 15.0 g/dL 40.112.2  HCT Latest Ref Range: 36.0 - 46.0 % 38.0  MCV Latest Ref Range: 80.0 - 100.0 fL 93.1  MCH Latest Ref Range: 26.0 - 34.0 pg 29.9  MCHC Latest Ref Range: 30.0 - 36.0 g/dL 02.732.1  RDW Latest Ref Range: 11.5 - 15.5 % 12.0  Platelets Latest Ref Range: 150 - 400 K/uL 295  nRBC Latest Ref Range: 0.0 - 0.2 % 0.0    Review of Systems  Constitution: Negative for decreased appetite, malaise/fatigue, weight gain and weight loss.  HENT: Negative for congestion.   Eyes: Negative for visual disturbance.  Cardiovascular: Positive for chest pain and dyspnea on exertion. Negative for leg swelling, palpitations and syncope.  Respiratory: Positive for shortness of breath. Negative for cough.   Endocrine: Negative for cold intolerance.  Hematologic/Lymphatic: Does not bruise/bleed easily.  Skin: Negative for itching and rash.  Musculoskeletal: Negative for myalgias.  Gastrointestinal: Negative for abdominal pain, nausea and vomiting.  Genitourinary: Negative for dysuria.  Neurological: Negative for dizziness and weakness.  Psychiatric/Behavioral: The patient is not nervous/anxious.   All other systems reviewed and  are negative.        Vitals not available.   Objective:   Physical Exam   Not performed. Telephone visit.         Assessment & Recommendations:   43 y.o. Caucasian female with chest pain.  Chest pain: Atypical.  Low risk stress test with small area of reduced tracer uptake, most likely due to attenuation.  Given her nonexertional nature of symptoms, I do not believe this is angina.  No treatment necessary at this time.  I will see her on as-needed basis.    I recommend screening lipid panel through her PCP.  Elder NegusManish J Emerson Schreifels, MD [facility] Pager: [phone] Office: [phone] If no answer Cell [phone]

## 2018-10-28 DIAGNOSIS — Z13220 Encounter for screening for lipoid disorders: Secondary | ICD-10-CM | POA: Diagnosis not present

## 2018-10-28 DIAGNOSIS — Z8379 Family history of other diseases of the digestive system: Secondary | ICD-10-CM | POA: Diagnosis not present

## 2018-10-28 DIAGNOSIS — F419 Anxiety disorder, unspecified: Secondary | ICD-10-CM | POA: Diagnosis not present

## 2018-10-28 DIAGNOSIS — K219 Gastro-esophageal reflux disease without esophagitis: Secondary | ICD-10-CM | POA: Diagnosis not present

## 2019-01-13 DIAGNOSIS — Z6841 Body Mass Index (BMI) 40.0 and over, adult: Secondary | ICD-10-CM | POA: Diagnosis not present

## 2019-01-13 DIAGNOSIS — K812 Acute cholecystitis with chronic cholecystitis: Secondary | ICD-10-CM | POA: Diagnosis not present

## 2019-01-13 DIAGNOSIS — K810 Acute cholecystitis: Secondary | ICD-10-CM | POA: Diagnosis not present

## 2019-01-13 DIAGNOSIS — E669 Obesity, unspecified: Secondary | ICD-10-CM | POA: Diagnosis not present

## 2019-01-13 DIAGNOSIS — R309 Painful micturition, unspecified: Secondary | ICD-10-CM | POA: Diagnosis not present

## 2019-01-13 DIAGNOSIS — F329 Major depressive disorder, single episode, unspecified: Secondary | ICD-10-CM | POA: Diagnosis not present

## 2019-01-13 DIAGNOSIS — K828 Other specified diseases of gallbladder: Secondary | ICD-10-CM | POA: Diagnosis not present

## 2019-01-13 DIAGNOSIS — K8000 Calculus of gallbladder with acute cholecystitis without obstruction: Secondary | ICD-10-CM | POA: Diagnosis not present

## 2019-01-13 DIAGNOSIS — R1011 Right upper quadrant pain: Secondary | ICD-10-CM | POA: Diagnosis not present

## 2019-01-13 DIAGNOSIS — K819 Cholecystitis, unspecified: Secondary | ICD-10-CM | POA: Diagnosis not present

## 2019-01-13 DIAGNOSIS — K8020 Calculus of gallbladder without cholecystitis without obstruction: Secondary | ICD-10-CM | POA: Diagnosis not present

## 2019-01-13 DIAGNOSIS — Z20828 Contact with and (suspected) exposure to other viral communicable diseases: Secondary | ICD-10-CM | POA: Diagnosis not present

## 2019-06-30 DIAGNOSIS — M5136 Other intervertebral disc degeneration, lumbar region: Secondary | ICD-10-CM | POA: Diagnosis not present

## 2019-08-23 DIAGNOSIS — H524 Presbyopia: Secondary | ICD-10-CM | POA: Diagnosis not present

## 2019-11-14 DIAGNOSIS — Z01419 Encounter for gynecological examination (general) (routine) without abnormal findings: Secondary | ICD-10-CM | POA: Diagnosis not present

## 2019-11-14 DIAGNOSIS — Z3040 Encounter for surveillance of contraceptives, unspecified: Secondary | ICD-10-CM | POA: Diagnosis not present

## 2019-11-14 DIAGNOSIS — Z1231 Encounter for screening mammogram for malignant neoplasm of breast: Secondary | ICD-10-CM | POA: Diagnosis not present

## 2019-11-14 DIAGNOSIS — Z6841 Body Mass Index (BMI) 40.0 and over, adult: Secondary | ICD-10-CM | POA: Diagnosis not present

## 2019-11-18 DIAGNOSIS — K219 Gastro-esophageal reflux disease without esophagitis: Secondary | ICD-10-CM | POA: Diagnosis not present

## 2019-11-18 DIAGNOSIS — M5136 Other intervertebral disc degeneration, lumbar region: Secondary | ICD-10-CM | POA: Diagnosis not present

## 2019-11-18 DIAGNOSIS — F419 Anxiety disorder, unspecified: Secondary | ICD-10-CM | POA: Diagnosis not present

## 2019-12-12 DIAGNOSIS — Z13220 Encounter for screening for lipoid disorders: Secondary | ICD-10-CM | POA: Diagnosis not present

## 2020-02-28 IMAGING — DX CHEST - 2 VIEW
2 series · 2 of 2 positions shown · non-contrast
Comparison: None.

CLINICAL DATA: Chest pain and shortness of breath.

EXAM:
CHEST - 2 VIEW

[chest pa]
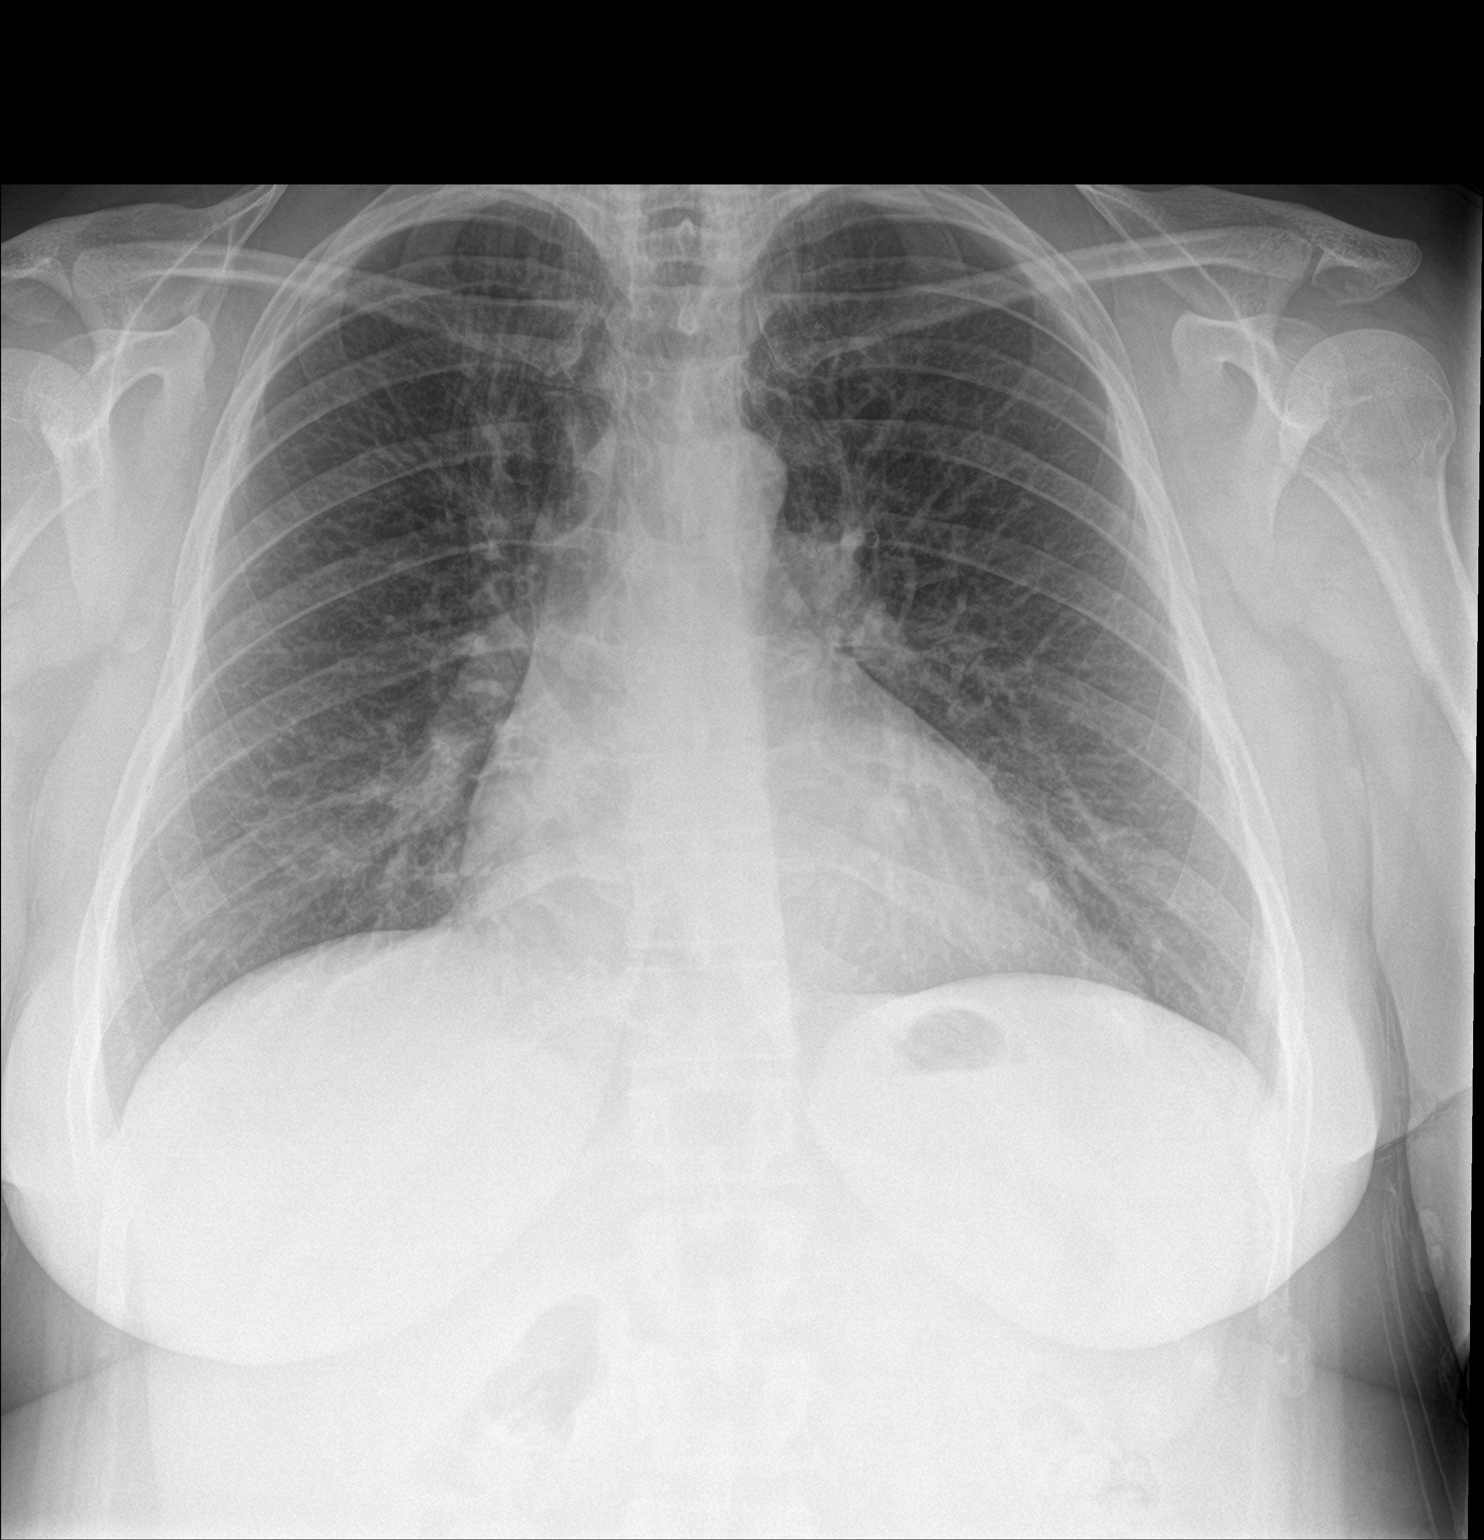

[chest lat]
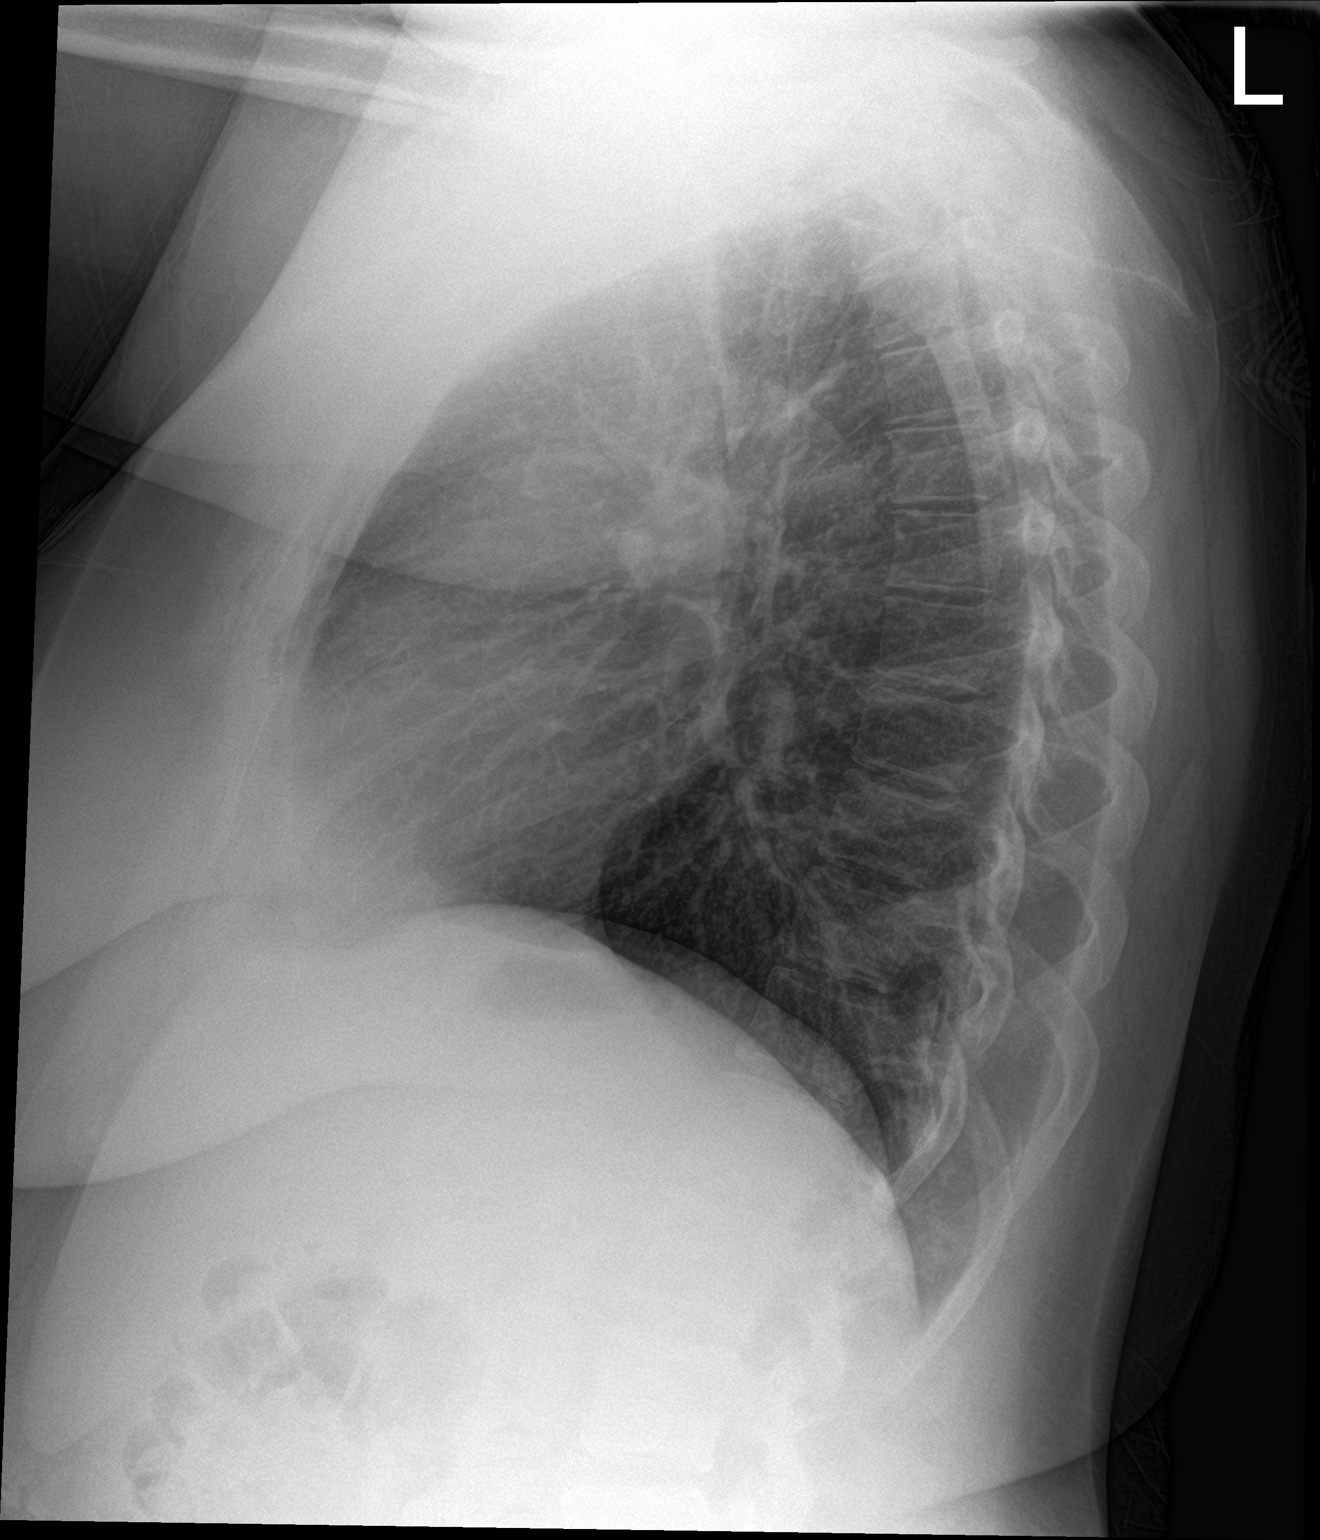

[2 of 2 positions shown; findings below may reference images not displayed]

FINDINGS: The cardiomediastinal contours are normal. Mild bronchial
thickening. Pulmonary vasculature is normal. No consolidation,
pleural effusion, or pneumothorax. No acute osseous abnormalities
are seen.
IMPRESSION: Mild bronchial thickening which can be seen with bronchitis or
asthma.

## 2020-05-28 DIAGNOSIS — M5136 Other intervertebral disc degeneration, lumbar region: Secondary | ICD-10-CM | POA: Diagnosis not present

## 2020-05-28 DIAGNOSIS — F419 Anxiety disorder, unspecified: Secondary | ICD-10-CM | POA: Diagnosis not present

## 2020-05-28 DIAGNOSIS — K219 Gastro-esophageal reflux disease without esophagitis: Secondary | ICD-10-CM | POA: Diagnosis not present

## 2020-10-25 DIAGNOSIS — F419 Anxiety disorder, unspecified: Secondary | ICD-10-CM | POA: Diagnosis not present

## 2020-10-25 DIAGNOSIS — M5136 Other intervertebral disc degeneration, lumbar region: Secondary | ICD-10-CM | POA: Diagnosis not present

## 2020-10-25 DIAGNOSIS — K219 Gastro-esophageal reflux disease without esophagitis: Secondary | ICD-10-CM | POA: Diagnosis not present

## 2020-11-14 DIAGNOSIS — Z1231 Encounter for screening mammogram for malignant neoplasm of breast: Secondary | ICD-10-CM | POA: Diagnosis not present

## 2020-11-14 DIAGNOSIS — Z01419 Encounter for gynecological examination (general) (routine) without abnormal findings: Secondary | ICD-10-CM | POA: Diagnosis not present

## 2020-11-14 DIAGNOSIS — Z3040 Encounter for surveillance of contraceptives, unspecified: Secondary | ICD-10-CM | POA: Diagnosis not present

## 2020-11-14 DIAGNOSIS — Z6841 Body Mass Index (BMI) 40.0 and over, adult: Secondary | ICD-10-CM | POA: Diagnosis not present

## 2020-11-14 DIAGNOSIS — Z124 Encounter for screening for malignant neoplasm of cervix: Secondary | ICD-10-CM | POA: Diagnosis not present

## 2021-05-07 DIAGNOSIS — J209 Acute bronchitis, unspecified: Secondary | ICD-10-CM | POA: Diagnosis not present

## 2021-08-30 DIAGNOSIS — K219 Gastro-esophageal reflux disease without esophagitis: Secondary | ICD-10-CM | POA: Diagnosis not present

## 2021-08-30 DIAGNOSIS — F419 Anxiety disorder, unspecified: Secondary | ICD-10-CM | POA: Diagnosis not present

## 2021-08-30 DIAGNOSIS — M5136 Other intervertebral disc degeneration, lumbar region: Secondary | ICD-10-CM | POA: Diagnosis not present

## 2021-12-13 DIAGNOSIS — Z6841 Body Mass Index (BMI) 40.0 and over, adult: Secondary | ICD-10-CM | POA: Diagnosis not present

## 2021-12-13 DIAGNOSIS — Z124 Encounter for screening for malignant neoplasm of cervix: Secondary | ICD-10-CM | POA: Diagnosis not present

## 2021-12-13 DIAGNOSIS — Z1231 Encounter for screening mammogram for malignant neoplasm of breast: Secondary | ICD-10-CM | POA: Diagnosis not present

## 2021-12-13 DIAGNOSIS — Z01419 Encounter for gynecological examination (general) (routine) without abnormal findings: Secondary | ICD-10-CM | POA: Diagnosis not present

## 2021-12-31 DIAGNOSIS — Z1211 Encounter for screening for malignant neoplasm of colon: Secondary | ICD-10-CM | POA: Diagnosis not present

## 2022-01-03 DIAGNOSIS — M5136 Other intervertebral disc degeneration, lumbar region: Secondary | ICD-10-CM | POA: Diagnosis not present

## 2022-01-07 LAB — COLOGUARD: COLOGUARD: NEGATIVE

## 2022-01-07 LAB — EXTERNAL GENERIC LAB PROCEDURE: COLOGUARD: NEGATIVE

## 2022-08-05 DIAGNOSIS — N951 Menopausal and female climacteric states: Secondary | ICD-10-CM | POA: Diagnosis not present

## 2022-08-05 DIAGNOSIS — M25511 Pain in right shoulder: Secondary | ICD-10-CM | POA: Diagnosis not present

## 2022-08-05 DIAGNOSIS — Z0000 Encounter for general adult medical examination without abnormal findings: Secondary | ICD-10-CM | POA: Diagnosis not present

## 2022-08-05 DIAGNOSIS — F419 Anxiety disorder, unspecified: Secondary | ICD-10-CM | POA: Diagnosis not present

## 2022-08-06 ENCOUNTER — Other Ambulatory Visit: Payer: Self-pay | Admitting: Family Medicine

## 2022-08-06 DIAGNOSIS — M25511 Pain in right shoulder: Secondary | ICD-10-CM

## 2022-08-14 DIAGNOSIS — Z131 Encounter for screening for diabetes mellitus: Secondary | ICD-10-CM | POA: Diagnosis not present

## 2022-08-14 DIAGNOSIS — Z13220 Encounter for screening for lipoid disorders: Secondary | ICD-10-CM | POA: Diagnosis not present

## 2022-08-14 DIAGNOSIS — Z6841 Body Mass Index (BMI) 40.0 and over, adult: Secondary | ICD-10-CM | POA: Diagnosis not present

## 2022-08-24 ENCOUNTER — Other Ambulatory Visit: Payer: BC Managed Care – PPO

## 2022-08-25 ENCOUNTER — Ambulatory Visit
Admission: RE | Admit: 2022-08-25 | Discharge: 2022-08-25 | Disposition: A | Discharge status: Home / Self Care | Payer: BC Managed Care – PPO | Source: Ambulatory Visit | Attending: Family Medicine | Admitting: Family Medicine

## 2022-08-25 DIAGNOSIS — M25511 Pain in right shoulder: Secondary | ICD-10-CM | POA: Diagnosis not present

## 2022-08-25 DIAGNOSIS — R600 Localized edema: Secondary | ICD-10-CM | POA: Diagnosis not present

## 2022-09-10 DIAGNOSIS — M25511 Pain in right shoulder: Secondary | ICD-10-CM | POA: Diagnosis not present

## 2022-09-10 DIAGNOSIS — M25519 Pain in unspecified shoulder: Secondary | ICD-10-CM | POA: Diagnosis not present

## 2022-09-10 DIAGNOSIS — M75101 Unspecified rotator cuff tear or rupture of right shoulder, not specified as traumatic: Secondary | ICD-10-CM | POA: Diagnosis not present

## 2022-12-15 DIAGNOSIS — R109 Unspecified abdominal pain: Secondary | ICD-10-CM | POA: Diagnosis not present

## 2022-12-15 DIAGNOSIS — R140 Abdominal distension (gaseous): Secondary | ICD-10-CM | POA: Diagnosis not present

## 2022-12-15 DIAGNOSIS — R1030 Lower abdominal pain, unspecified: Secondary | ICD-10-CM | POA: Diagnosis not present

## 2022-12-15 DIAGNOSIS — R197 Diarrhea, unspecified: Secondary | ICD-10-CM | POA: Diagnosis not present

## 2022-12-22 DIAGNOSIS — R197 Diarrhea, unspecified: Secondary | ICD-10-CM | POA: Diagnosis not present

## 2023-02-05 DIAGNOSIS — F419 Anxiety disorder, unspecified: Secondary | ICD-10-CM | POA: Diagnosis not present

## 2023-02-05 DIAGNOSIS — Z13220 Encounter for screening for lipoid disorders: Secondary | ICD-10-CM | POA: Diagnosis not present

## 2023-02-05 DIAGNOSIS — M51369 Other intervertebral disc degeneration, lumbar region without mention of lumbar back pain or lower extremity pain: Secondary | ICD-10-CM | POA: Diagnosis not present

## 2023-02-05 DIAGNOSIS — R7309 Other abnormal glucose: Secondary | ICD-10-CM | POA: Diagnosis not present

## 2023-02-05 DIAGNOSIS — K219 Gastro-esophageal reflux disease without esophagitis: Secondary | ICD-10-CM | POA: Diagnosis not present

## 2023-02-25 DIAGNOSIS — Z1231 Encounter for screening mammogram for malignant neoplasm of breast: Secondary | ICD-10-CM | POA: Diagnosis not present

## 2023-02-25 DIAGNOSIS — Z01419 Encounter for gynecological examination (general) (routine) without abnormal findings: Secondary | ICD-10-CM | POA: Diagnosis not present

## 2023-02-25 DIAGNOSIS — Z309 Encounter for contraceptive management, unspecified: Secondary | ICD-10-CM | POA: Diagnosis not present

## 2023-02-25 DIAGNOSIS — Z1339 Encounter for screening examination for other mental health and behavioral disorders: Secondary | ICD-10-CM | POA: Diagnosis not present

## 2023-03-03 ENCOUNTER — Encounter: Payer: Self-pay | Admitting: Pediatrics

## 2023-05-07 ENCOUNTER — Ambulatory Visit: Payer: BC Managed Care – PPO | Admitting: Pediatrics

## 2023-09-03 DIAGNOSIS — N951 Menopausal and female climacteric states: Secondary | ICD-10-CM | POA: Diagnosis not present

## 2023-09-03 DIAGNOSIS — K219 Gastro-esophageal reflux disease without esophagitis: Secondary | ICD-10-CM | POA: Diagnosis not present

## 2023-09-03 DIAGNOSIS — F419 Anxiety disorder, unspecified: Secondary | ICD-10-CM | POA: Diagnosis not present

## 2023-09-03 DIAGNOSIS — R140 Abdominal distension (gaseous): Secondary | ICD-10-CM | POA: Diagnosis not present

## 2024-01-01 ENCOUNTER — Other Ambulatory Visit: Payer: Self-pay | Admitting: Obstetrics and Gynecology

## 2024-01-01 DIAGNOSIS — Z1231 Encounter for screening mammogram for malignant neoplasm of breast: Secondary | ICD-10-CM

## 2024-02-22 DIAGNOSIS — J020 Streptococcal pharyngitis: Secondary | ICD-10-CM | POA: Diagnosis not present

## 2024-02-22 DIAGNOSIS — J029 Acute pharyngitis, unspecified: Secondary | ICD-10-CM | POA: Diagnosis not present

## 2024-02-22 DIAGNOSIS — R590 Localized enlarged lymph nodes: Secondary | ICD-10-CM | POA: Diagnosis not present

## 2024-02-22 DIAGNOSIS — R059 Cough, unspecified: Secondary | ICD-10-CM | POA: Diagnosis not present

## 2024-02-22 DIAGNOSIS — R509 Fever, unspecified: Secondary | ICD-10-CM | POA: Diagnosis not present

## 2024-03-02 DIAGNOSIS — Z124 Encounter for screening for malignant neoplasm of cervix: Secondary | ICD-10-CM | POA: Diagnosis not present

## 2024-03-02 DIAGNOSIS — Z1330 Encounter for screening examination for mental health and behavioral disorders, unspecified: Secondary | ICD-10-CM | POA: Diagnosis not present

## 2024-03-02 DIAGNOSIS — Z01419 Encounter for gynecological examination (general) (routine) without abnormal findings: Secondary | ICD-10-CM | POA: Diagnosis not present

## 2024-03-02 DIAGNOSIS — N898 Other specified noninflammatory disorders of vagina: Secondary | ICD-10-CM | POA: Diagnosis not present

## 2024-03-04 DIAGNOSIS — K219 Gastro-esophageal reflux disease without esophagitis: Secondary | ICD-10-CM | POA: Diagnosis not present

## 2024-03-04 DIAGNOSIS — E7800 Pure hypercholesterolemia, unspecified: Secondary | ICD-10-CM | POA: Diagnosis not present

## 2024-03-04 DIAGNOSIS — F419 Anxiety disorder, unspecified: Secondary | ICD-10-CM | POA: Diagnosis not present
